# Patient Record
Sex: Female | Born: 1970 | Race: White | Hispanic: No | Marital: Married | Smoking: Never smoker
Health system: Southern US, Community
[De-identification: ages and names within clinical notes are randomized; demographics above are authoritative.]

## PROBLEM LIST (undated history)

## (undated) HISTORY — PX: COSMETIC SURGERY: SHX468

## (undated) HISTORY — PX: OTHER SURGICAL HISTORY: SHX169

---

## 2006-12-23 HISTORY — PX: AUGMENTATION MAMMAPLASTY: SUR837

## 2014-09-28 DIAGNOSIS — R1030 Lower abdominal pain, unspecified: Secondary | ICD-10-CM | POA: Insufficient documentation

## 2014-09-28 DIAGNOSIS — F32A Depression, unspecified: Secondary | ICD-10-CM | POA: Insufficient documentation

## 2014-09-28 DIAGNOSIS — F329 Major depressive disorder, single episode, unspecified: Secondary | ICD-10-CM | POA: Insufficient documentation

## 2014-09-28 DIAGNOSIS — M549 Dorsalgia, unspecified: Secondary | ICD-10-CM | POA: Insufficient documentation

## 2015-07-12 ENCOUNTER — Ambulatory Visit (INDEPENDENT_AMBULATORY_CARE_PROVIDER_SITE_OTHER): Payer: Managed Care, Other (non HMO) | Admitting: Family Medicine

## 2015-07-12 VITALS — BP 118/80 | HR 72 | Temp 98.3°F | Resp 16 | Ht 66.0 in | Wt 134.2 lb

## 2015-07-12 DIAGNOSIS — D649 Anemia, unspecified: Secondary | ICD-10-CM

## 2015-07-12 DIAGNOSIS — R079 Chest pain, unspecified: Secondary | ICD-10-CM

## 2015-07-12 DIAGNOSIS — R109 Unspecified abdominal pain: Secondary | ICD-10-CM | POA: Diagnosis not present

## 2015-07-12 DIAGNOSIS — R319 Hematuria, unspecified: Secondary | ICD-10-CM

## 2015-07-12 DIAGNOSIS — R1011 Right upper quadrant pain: Secondary | ICD-10-CM

## 2015-07-12 LAB — POCT UA - MICROSCOPIC ONLY
Casts, Ur, LPF, POC: NEGATIVE
Crystals, Ur, HPF, POC: NEGATIVE
Mucus, UA: NEGATIVE
RBC, URINE, MICROSCOPIC: NEGATIVE
YEAST UA: NEGATIVE

## 2015-07-12 LAB — POCT CBC
Granulocyte percent: 56.4 %G (ref 37–80)
HCT, POC: 26.6 % — AB (ref 37.7–47.9)
HEMOGLOBIN: 8.4 g/dL — AB (ref 12.2–16.2)
Lymph, poc: 2.6 (ref 0.6–3.4)
MCH: 23.8 pg — AB (ref 27–31.2)
MCHC: 31.8 g/dL (ref 31.8–35.4)
MCV: 75 fL — AB (ref 80–97)
MID (cbc): 0.4 (ref 0–0.9)
MPV: 6.8 fL (ref 0–99.8)
POC Granulocyte: 3.8 (ref 2–6.9)
POC LYMPH %: 38.1 % (ref 10–50)
POC MID %: 5.5 %M (ref 0–12)
Platelet Count, POC: 385 10*3/uL (ref 142–424)
RBC: 3.55 M/uL — AB (ref 4.04–5.48)
RDW, POC: 18.8 %
WBC: 6.8 10*3/uL (ref 4.6–10.2)

## 2015-07-12 LAB — POCT URINE PREGNANCY: PREG TEST UR: NEGATIVE

## 2015-07-12 LAB — POCT URINALYSIS DIPSTICK
Bilirubin, UA: NEGATIVE
Blood, UA: NEGATIVE
Glucose, UA: NEGATIVE
Ketones, UA: NEGATIVE
Leukocytes, UA: NEGATIVE
NITRITE UA: NEGATIVE
PH UA: 7.5
Protein, UA: NEGATIVE
Spec Grav, UA: 1.015
Urobilinogen, UA: 1

## 2015-07-12 LAB — D-DIMER, QUANTITATIVE (NOT AT ARMC): D DIMER QUANT: 0.27 ug{FEU}/mL (ref 0.00–0.48)

## 2015-07-12 NOTE — Patient Instructions (Addendum)
You're still anemic in our office today. Restart iron 1-2 times per day. I will check other lab tests as discussed, and will arrange for an ultrasound to look at the liver and other possible causes of your pain. Because of the anemia, stop the diclofenac for right now just in case this anemia is due to stomach issues. Recheck in the next 1 week with me and to repeat your blood count. If any increased lightheadedness or dizziness  or increased fatigue prior to that time, return here or emergency room as this can be a sign of worsening anemia.  Return to the clinic or go to the nearest emergency room if any of your symptoms worsen or new symptoms occur.

## 2015-07-12 NOTE — Progress Notes (Addendum)
Subjective:    Patient ID: Barbara Wong, female    DOB: 09/19/1971, 44 y.o.   MRN: [id number] This chart was scribed for Meredith Staggers, MD by Jolene Provost, Medical Scribe. This patient was seen in Room 11 and the patient's care was started a 6:12 PM.  Chief Complaint  Patient presents with  . Flank Pain    x 2 months    HPI HPI Comments: Barbara Wong is a 44 y.o. female who presents to [facility] complaining of RUQ abdominal pain for the last two months. Pt was seen by her PCP Dr. Cleophas Dunker five days ago for the same sx and was noted to have hematuria on urinalysis and slight anemia. Pt states her abdominal pain is worse with walking or standing for long periods and has gotten worse over the last two months (lasting longer, more severe pain, coming more often). Pt denies increased pain with deep breathing. Pt denies SOB or cough. Pt denies past hx of stomach ulcers. Pt denies heavy menses or changes in menses in the last few months. Pt states her menstrual cycle lasts 4-5 days, and is heavy on the first day but light after that. Pt denies tarry stools or blood in stools.   Pt states she is intermittently light headed, with no change over the last few months. Pt states she also has intermittent mild left chest pain, not associated with exertion. Pt denies heart racing or palpitations. Pt denies hx of blood clots, or family hx of blood clots. Pt states she has a past hx of UTI and bladder infections. Pt denies PMHx of kidney infection. Pt denies hematuria, dysuria, urinary frequency or urgency. Pt denies nausea, vomiting. Pt states her pain is not associated with eating any specific foods.   Pt states her PCP prescribed her voltaren  once per day, zanaflex  once per day.   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  There are no active problems to display for this patient.  History reviewed. No pertinent past medical history. Past Surgical History  Procedure Laterality Date  .  Cesarean section    . 3 c-sections    . Cosmetic surgery     No Known Allergies Prior to Admission medications   Not on File   History   Social History  . Marital Status: Married    Spouse Name: N/A  . Number of Children: N/A  . Years of Education: N/A   Occupational History  . Not on file.   Social History Main Topics  . Smoking status: Never Smoker   . Smokeless tobacco: Not on file  . Alcohol Use: No  . Drug Use: No  . Sexual Activity: Not on file   Other Topics Concern  . Not on file   Social History Narrative  . No narrative on file    Review of Systems  Constitutional: Negative for fever, chills, diaphoresis, activity change and unexpected weight change.  Respiratory: Negative for cough and shortness of breath.   Cardiovascular: Negative for palpitations and leg swelling.  Gastrointestinal: Positive for abdominal pain. Negative for nausea, vomiting, blood in stool and anal bleeding.  Genitourinary: Negative for dysuria, urgency, frequency, hematuria, difficulty urinating and menstrual problem.       Objective:   Physical Exam  Constitutional: She is oriented to person, place, and time. She appears well-developed and well-nourished. No distress.  HENT:  Head: Normocephalic and atraumatic.  Eyes: Conjunctivae and EOM are normal. Pupils are equal, round, and reactive  to light.  Neck: Neck supple. Carotid bruit is not present.  Cardiovascular: Normal rate, regular rhythm, normal heart sounds and intact distal pulses.   No murmur heard. Pulmonary/Chest: Effort normal and breath sounds normal. No respiratory distress. She has no wheezes.  Abdominal: Soft. She exhibits no pulsatile midline mass. There is no tenderness.  Slight tenderness in the RUQ just under the rib margin. Negative murphy's sign. Megative Mcburney's point.  Musculoskeletal: Normal range of motion.  No CVA tenderness. Ribs non tender.  Neurological: She is alert and oriented to person, place,  and time. Coordination normal.  Skin: Skin is warm and dry. She is not diaphoretic.  Psychiatric: She has a normal mood and affect. Her behavior is normal.  Nursing note and vitals reviewed.   Filed Vitals:   07/12/15 1801  BP: 118/80  Pulse: 72  Temp: 98.3 F (36.8 C)  TempSrc: Oral  Resp: 16  Height: 5\' 6"  (1.676 m)  Weight: 134 lb 3.2 oz (60.873 kg)  SpO2: 99%   Labs from other office drawn on July 15: Hemoglobin 8.9 HCT 27.9, RBC 3.61, WBC normal 6.5, MCV low 77, RDW elevated at 16.5, platelets 409, normal SED rate 11, normal CMP with creatinine 0.7, normal LFTs. Urine positive for urine greater than 300 protein. Positive nitrite, and moderate LE. Micro 0-1 WBC. Greater than 50 RBC, no bacteria.  Results for orders placed or performed in visit on 07/12/15  POCT CBC  Result Value Ref Range   WBC 6.8 4.6 - 10.2 K/uL   Lymph, poc 2.6 0.6 - 3.4   POC LYMPH PERCENT 38.1 10 - 50 %L   MID (cbc) 0.4 0 - 0.9   POC MID % 5.5 0 - 12 %M   POC Granulocyte 3.8 2 - 6.9   Granulocyte percent 56.4 37 - 80 %G   RBC 3.55 (A) 4.04 - 5.48 M/uL   Hemoglobin 8.4 (A) 12.2 - 16.2 g/dL   HCT, POC 16.126.6 (A) 09.637.7 - 47.9 %   MCV 75.0 (A) 80 - 97 fL   MCH, POC 23.8 (A) 27 - 31.2 pg   MCHC 31.8 31.8 - 35.4 g/dL   RDW, POC 04.518.8 %   Platelet Count, POC 385 142 - 424 K/uL   MPV 6.8 0 - 99.8 fL  POCT urinalysis dipstick  Result Value Ref Range   Color, UA yellow    Clarity, UA sl. cloudy    Glucose, UA negative    Bilirubin, UA negative    Ketones, UA negative    Spec Grav, UA 1.015    Blood, UA negative    pH, UA 7.5    Protein, UA negative    Urobilinogen, UA 1.0    Nitrite, UA negative    Leukocytes, UA Negative Negative  POCT urine pregnancy  Result Value Ref Range   Preg Test, Ur Negative Negative  POCT UA - Microscopic Only  Result Value Ref Range   WBC, Ur, HPF, POC 0-1    RBC, urine, microscopic Negative    Bacteria, U Microscopic 1+    Mucus, UA Negative    Epithelial cells,  urine per micros 5-10    Crystals, Ur, HPF, POC Negative    Casts, Ur, LPF, POC Negative    Yeast, UA Negative    Amorphous Large        Assessment & Plan:   Barbara Wong is a 44 y.o. female RUQ abdominal pain - Plan: POCT CBC, POCT urinalysis dipstick, POCT urine pregnancy,  US Abdomen CompleteRight flank pain - Plan: POCT urinalysis dipstick, POCT urine pregnancy, US Abdomen Complete  -Long-standing right upper quadrant/flank pain. Minimally tender on exam, but no association with food, negative Murphy's sign. Less likely gallbladder source but DDX includes functional GB disorder.   -Repeat urine testing today was negative for infection or hematuria.  -Will order abdominal ultrasound to evaluate for hepatomegaly, ovarian cyst or other cause of pain in her right upper quadrant.   -Recheck in one week to determine next step.  -Hold NSAID for now given anemia and this pain.  Chest pain, unspecified chest pain type - Plan: D-dimer, quantitative (not at [facility])  - Rare, fleeting sensation for some time. No acute changes recently. Has had intermittent leg pains in the past which may be RLS, but will check d-dimer to determine if further imaging needed. Unlikely DVT/PE, but with location of pain if this is elevated, consider CT of chest.  Anemia, unspecified anemia type - Plan: POCT CBC, IBC Panel, US Abdomen Complete  -Likely iron deficiency anemia by history.  Hemodynamically stable here in the office, but discussed fairly low hemoglobin at this time.  She is overall asymptomatic with the exception of only occasional lightheadedness with standing up quickly, will start over-the-counter iron supplementation 1-2 daily check iron panel, and repeat CBC in 1 week  -If any symptoms of lower hemoglobin such as lightheadedness dizziness or fatigue, advised to return here or emergency room for repeat CBC and evaluation  Hematuria - Plan: POCT urinalysis dipstick, POCT urine pregnancy, POCT UA - Microscopic  Only  -Resolved. Likely due to menses when checked last week.  Meds ordered this encounter  Medications  . diclofenac (VOLTAREN) 75 MG EC tablet    Sig: Take 75 mg by mouth daily.  Marland Kitchen tiZANidine (ZANAFLEX) 4 MG capsule    Sig: Take 4 mg by mouth daily.   Patient Instructions  You're still anemic in our office today. Restart iron 1-2 times per day. I will check other lab tests as discussed, and will arrange for an ultrasound to look at the liver and other possible causes of your pain. Because of the anemia, stop the diclofenac for right now just in case this anemia is due to stomach issues. Recheck in the next 1 week with me and to repeat your blood count. If any increased lightheadedness or dizziness  or increased fatigue prior to that time, return here or emergency room as this can be a sign of worsening anemia.  Return to the clinic or go to the nearest emergency room if any of your symptoms worsen or new symptoms occur.       I personally performed the services described in this documentation, which was scribed in my presence. The recorded information has been reviewed and considered, and addended by me as needed.

## 2015-07-17 LAB — IBC PANEL
%SAT: 4 % — AB (ref 20–55)
TIBC: 499 ug/dL — AB (ref 250–470)
UIBC: 480 ug/dL — AB (ref 125–400)

## 2015-07-17 LAB — IRON: IRON: 19 ug/dL — AB (ref 42–145)

## 2015-07-18 ENCOUNTER — Ambulatory Visit
Admission: RE | Admit: 2015-07-18 | Discharge: 2015-07-18 | Disposition: A | Discharge status: Home / Self Care | Payer: Managed Care, Other (non HMO) | Source: Ambulatory Visit | Attending: Family Medicine | Admitting: Family Medicine

## 2015-07-18 DIAGNOSIS — R1011 Right upper quadrant pain: Secondary | ICD-10-CM

## 2015-07-18 DIAGNOSIS — R109 Unspecified abdominal pain: Secondary | ICD-10-CM

## 2015-07-18 DIAGNOSIS — D649 Anemia, unspecified: Secondary | ICD-10-CM

## 2015-07-26 ENCOUNTER — Telehealth: Payer: Self-pay

## 2015-07-26 ENCOUNTER — Telehealth: Payer: Self-pay | Admitting: Nurse Practitioner

## 2015-07-26 NOTE — Telephone Encounter (Signed)
Pt states she doesn't think the over the counter iron is helping,should she have a rx   Best phone is [phone]    Pharmacy [facility] [address] in [place]

## 2015-07-27 MED ORDER — FERROUS SULFATE 325 (65 FE) MG PO TBEC: 325.0000 mg | DELAYED_RELEASE_TABLET | Freq: Three times a day (TID) | ORAL | Status: DC

## 2015-07-27 NOTE — Telephone Encounter (Signed)
Spoke with pt, advised Rx sent in and Mario's message.

## 2015-07-27 NOTE — Telephone Encounter (Signed)
Can we Rx? 

## 2015-07-27 NOTE — Telephone Encounter (Signed)
Pt understood.

## 2015-07-27 NOTE — Telephone Encounter (Signed)
It's not clear what symptom it is not helping. I certainly don't mind sending a script but it will take some time for her iron stores to replete. If she experiences constipation associated with iron supplementation, she should try docusate or miralax. Either way, she needs to come back for recheck of her anemia as it was pretty low. In cases such as these, a source needs to be identified. Thank you!

## 2015-07-27 NOTE — Telephone Encounter (Signed)
Left message for pt to call back  °

## 2019-10-12 ENCOUNTER — Ambulatory Visit (INDEPENDENT_AMBULATORY_CARE_PROVIDER_SITE_OTHER): Payer: Managed Care, Other (non HMO)

## 2019-10-12 ENCOUNTER — Other Ambulatory Visit: Payer: Self-pay

## 2019-10-12 ENCOUNTER — Ambulatory Visit (INDEPENDENT_AMBULATORY_CARE_PROVIDER_SITE_OTHER): Payer: Managed Care, Other (non HMO) | Admitting: Podiatry

## 2019-10-12 ENCOUNTER — Other Ambulatory Visit: Payer: Self-pay | Admitting: Podiatry

## 2019-10-12 DIAGNOSIS — S99921A Unspecified injury of right foot, initial encounter: Secondary | ICD-10-CM

## 2019-10-12 DIAGNOSIS — M778 Other enthesopathies, not elsewhere classified: Secondary | ICD-10-CM

## 2019-10-12 DIAGNOSIS — M7751 Other enthesopathy of right foot: Secondary | ICD-10-CM | POA: Diagnosis not present

## 2019-10-12 MED ORDER — MELOXICAM 15 MG PO TABS: 15.0000 mg | ORAL_TABLET | Freq: Every day | ORAL | 1 refills | Status: DC

## 2019-10-15 NOTE — Progress Notes (Signed)
   HPI: 48 y.o. female presenting today as a new patient with a chief complaint of intermittent throbbing pain to the right fourth toe that began about three months ago [place]. Touching the toenail increases the pain. She has been taking Ibuprofen for treatment. Patient is here for further evaluation and treatment.   No past medical history on file.   Physical Exam: General: The patient is alert and oriented x3 in no acute distress.  Dermatology: Skin is warm, dry and supple bilateral lower extremities. Negative for open lesions or macerations.  Vascular: Palpable pedal pulses bilaterally. No edema or erythema noted. Capillary refill within normal limits.  Neurological: Epicritic and protective threshold grossly intact bilaterally.   Musculoskeletal Exam: Pain with palpation noted to the right fourth toe. Range of motion within normal limits to all pedal and ankle joints bilateral. Muscle strength 5/5 in all groups bilateral.   Radiographic Exam:  Normal osseous mineralization. Joint spaces preserved. No fracture/dislocation/boney destruction.    Assessment: 1. Capsulitis right fourth toe   Plan of Care:  1. Patient evaluated. X-Rays reviewed.  2. Post op shoe dispensed. Weightbearing for four weeks.  3. Prescription for Meloxicam provided to patient. 4. Return to clinic as needed.       Edrick Kins, DPM [facility]  Dr. Edrick Kins, DPM    [address], [place] [address]                Office [phone]  Fax [phone]

## 2019-12-19 ENCOUNTER — Other Ambulatory Visit: Payer: Self-pay | Admitting: Podiatry

## 2020-02-23 ENCOUNTER — Other Ambulatory Visit: Payer: Self-pay

## 2020-02-23 MED ORDER — MELOXICAM 15 MG PO TABS: 15.0000 mg | ORAL_TABLET | Freq: Every day | ORAL | 3 refills | Status: AC

## 2020-02-23 NOTE — Telephone Encounter (Signed)
Pharmacy refill request for Meloxicam 15 mg   Per Dr. Michel Harrow verbal order, ok to give refills.  Script has been sent to pharmacy

## 2020-03-13 ENCOUNTER — Other Ambulatory Visit: Payer: Self-pay | Admitting: Obstetrics and Gynecology

## 2020-03-16 ENCOUNTER — Other Ambulatory Visit: Payer: Self-pay | Admitting: Nurse Practitioner

## 2020-03-16 DIAGNOSIS — N6320 Unspecified lump in the left breast, unspecified quadrant: Secondary | ICD-10-CM

## 2020-04-03 ENCOUNTER — Ambulatory Visit
Admission: RE | Admit: 2020-04-03 | Discharge: 2020-04-03 | Disposition: A | Discharge status: Home / Self Care | Payer: 59 | Source: Ambulatory Visit | Attending: Nurse Practitioner | Admitting: Nurse Practitioner

## 2020-04-03 ENCOUNTER — Other Ambulatory Visit: Payer: Self-pay | Admitting: Nurse Practitioner

## 2020-04-03 DIAGNOSIS — N6320 Unspecified lump in the left breast, unspecified quadrant: Secondary | ICD-10-CM

## 2020-06-14 IMAGING — US US BREAST*L* LIMITED INC AXILLA
1 series · 13 of 20 positions shown · non-contrast
Comparison: None.

CLINICAL DATA: 48-year-old female presenting for evaluation of a
palpable lump in the medial left breast. Her doctor gave her course
of antibiotics.

EXAM:
2D DIGITAL DIAGNOSTIC BILATERAL MAMMOGRAM WITH IMPLANTS, CAD AND
ADJUNCT TOMO
The patient has retropectoral implants. Standard and implant
displaced views were performed.
BILATERAL BREAST ULTRASOUND

[Series 1: us breast*left* limited inc axilla · 0.06mm/px · 13 of 20 slices shown]
[im 1/20]
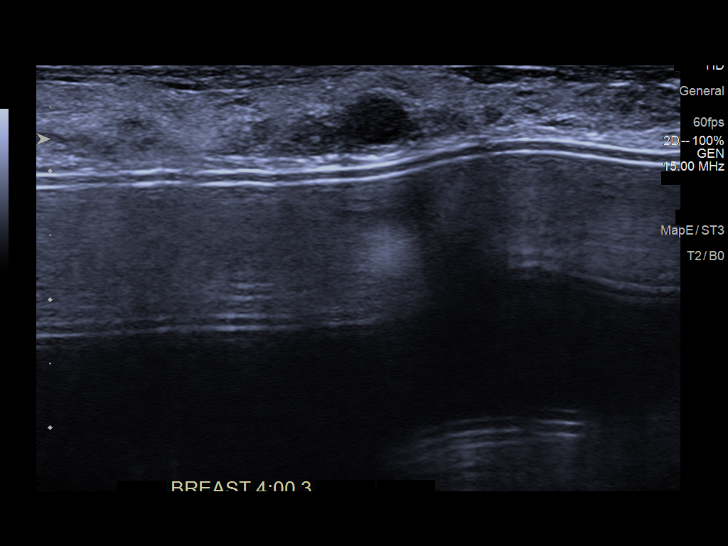
[im 3/20]
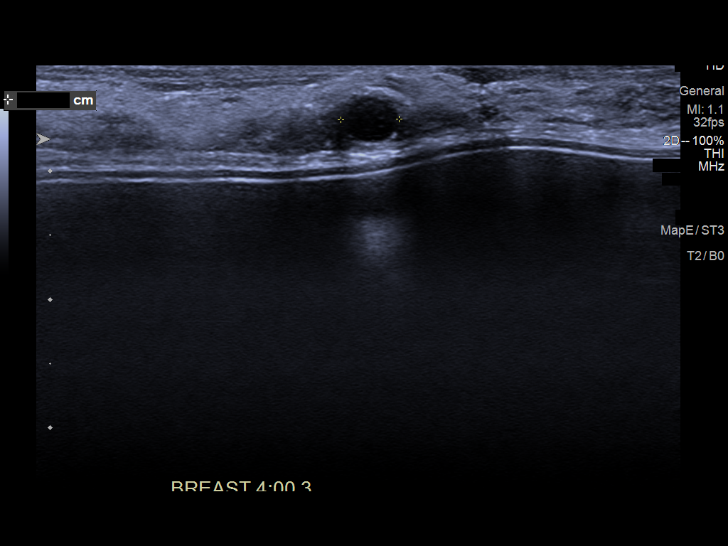
[im 4/20]
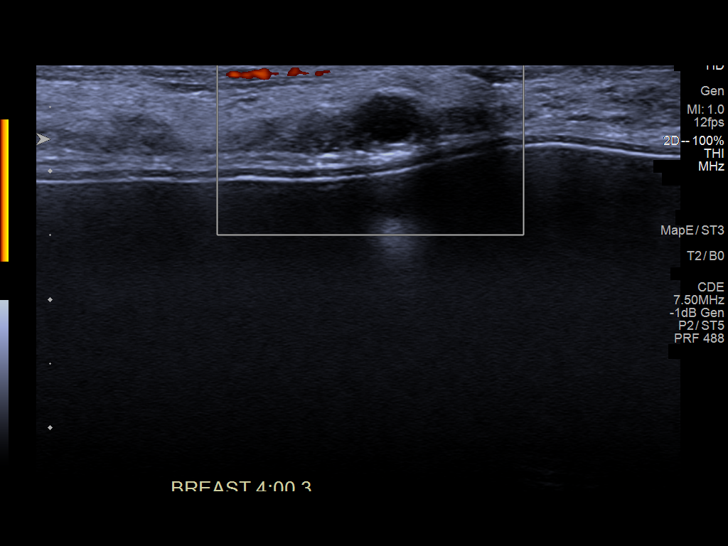
[im 6/20]
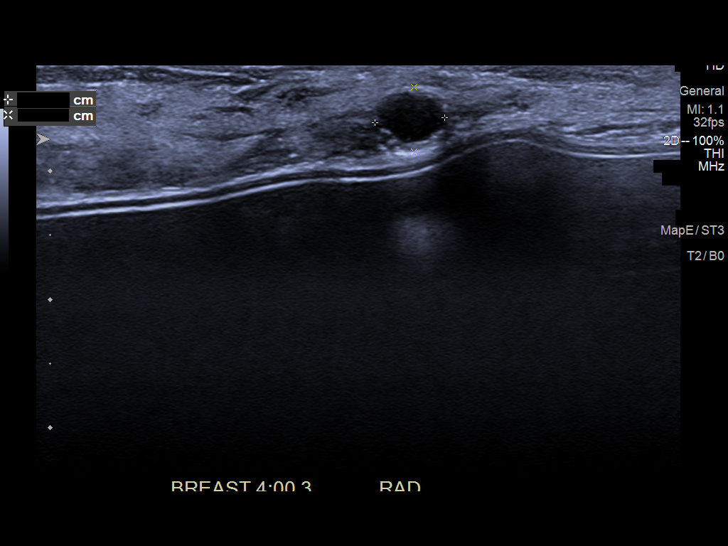
[im 7/20]
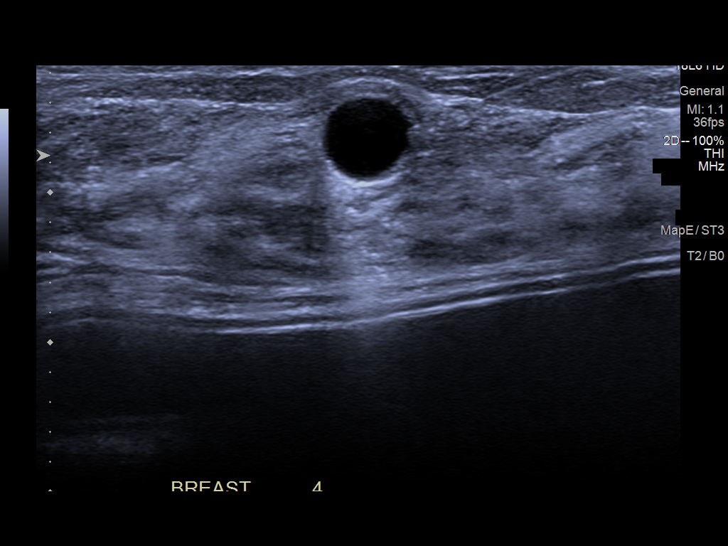
[im 9/20]
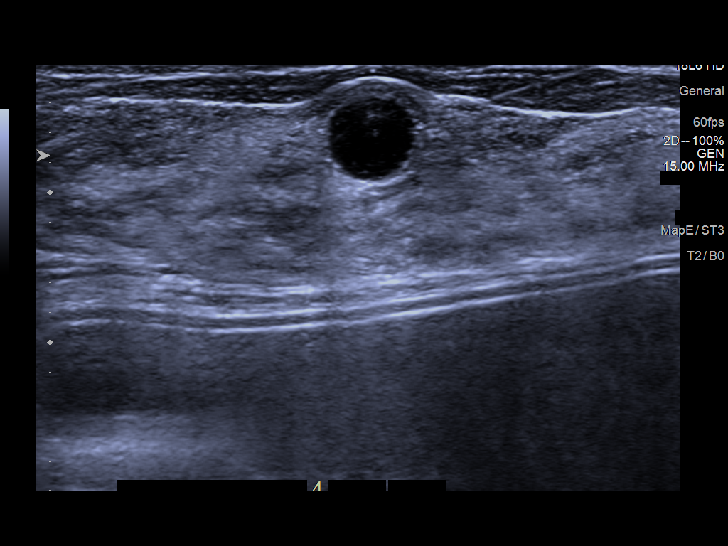
[im 11/20]
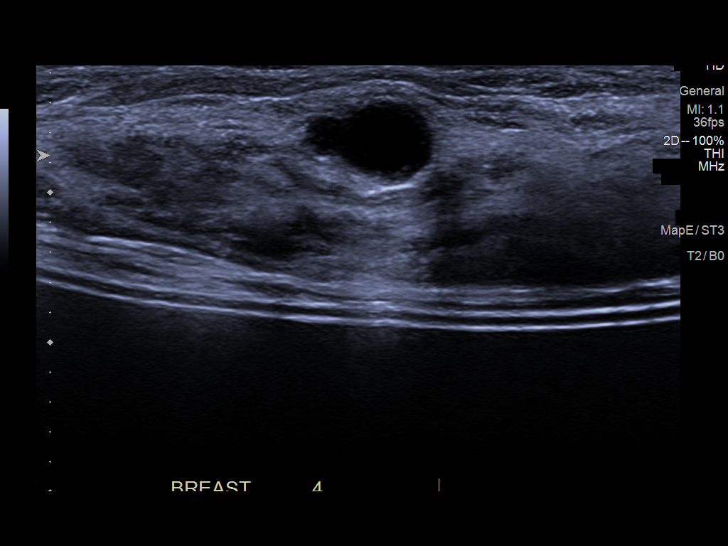
[im 12/20]
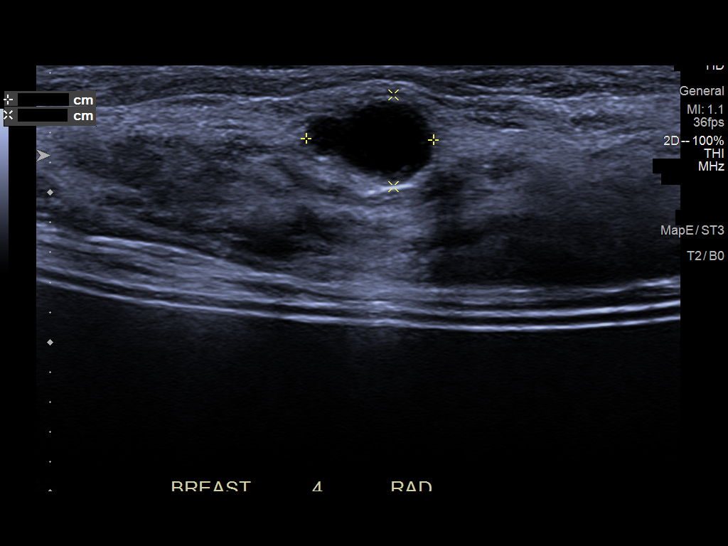
[im 14/20]
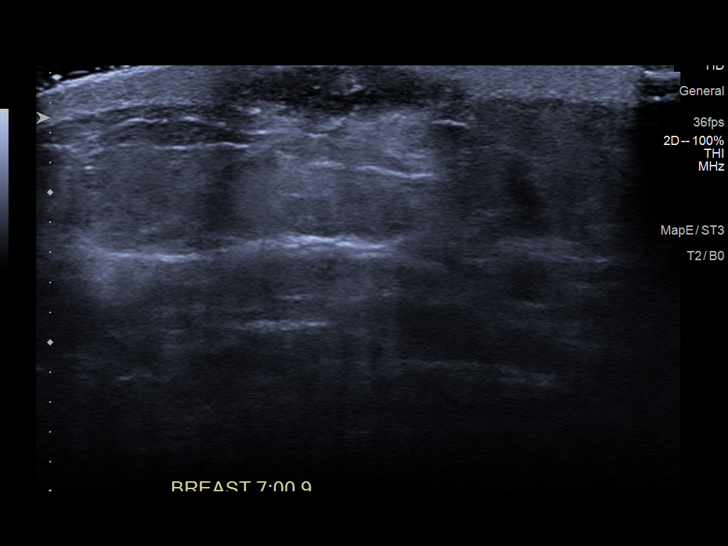
[im 15/20]
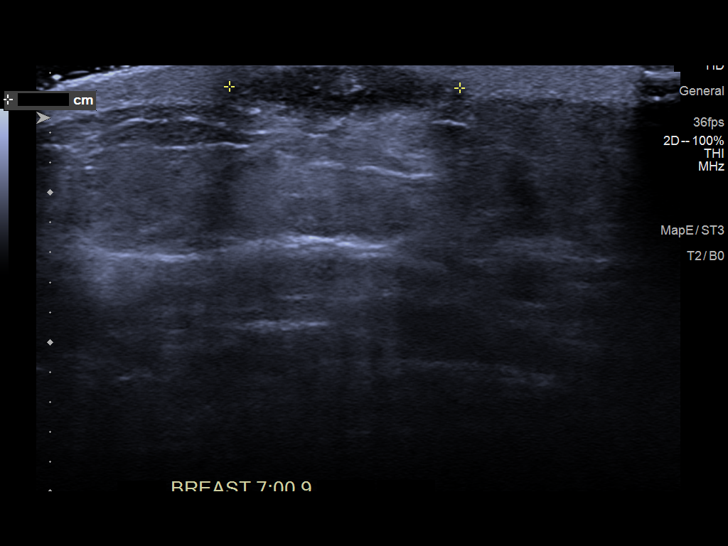
[im 17/20]
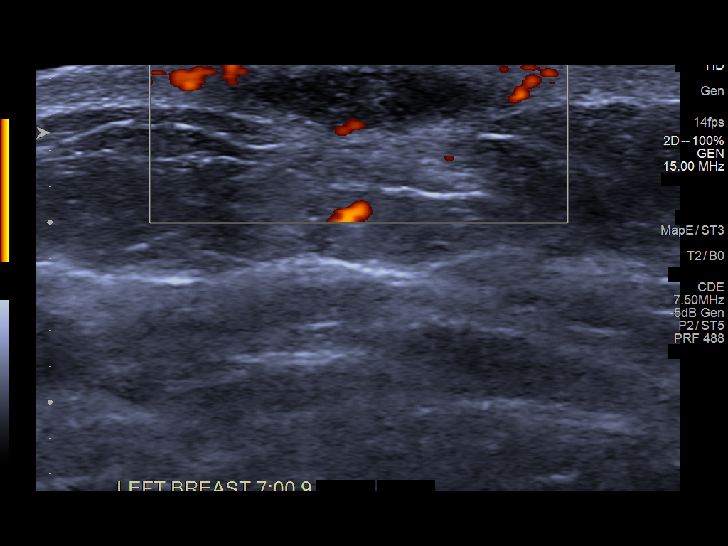
[im 18/20]
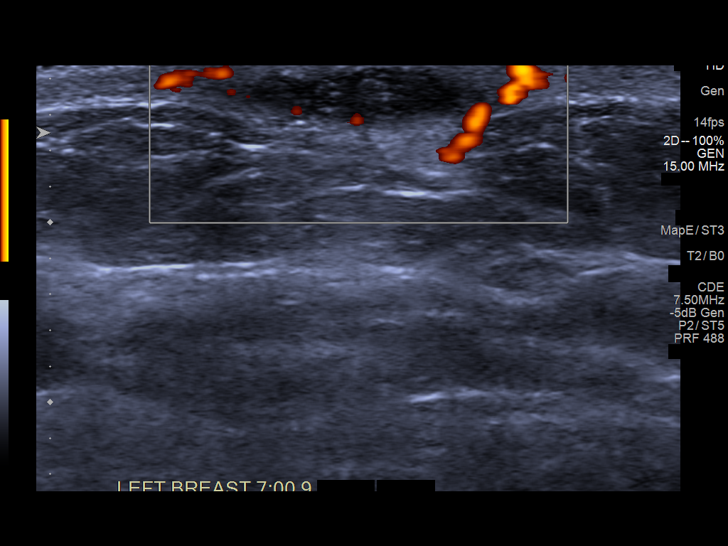
[im 20/20]
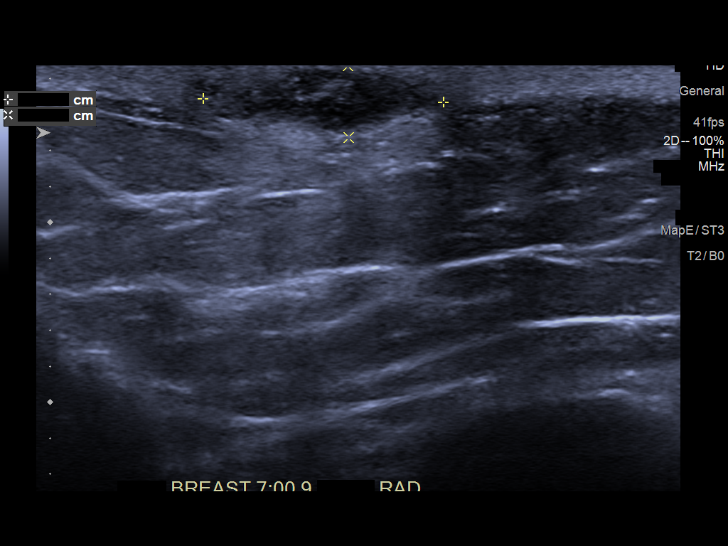

[13 of 20 positions shown; findings below may reference images not displayed]

ACR Breast Density Category d: The breast tissue is extremely dense,
which lowers the sensitivity of mammography.
FINDINGS: In the inferior to lower outer quadrant of the right breast,
posterior depth there is a probable oval obscured mass. Another
possible obscured masses seen the lateral posterior left breast.
Spot compression tomosynthesis images over the palpable site of
concern demonstrate a focal area of skin thickening and subcutaneous
stranding. No other suspicious calcifications, masses or areas of
distortion are seen in the bilateral breasts.

Mammographic images were processed with CAD.

Physical exam of the palpable site in the medial left breast
demonstrates a protruding superficial palpable lump with a central
punctum, consistent with a sebaceous cyst.

Ultrasound of the palpable site in the left breast at 7 o'clock, 9
cm from the nipple demonstrates a hypoechoic mass within the skin
measuring 1.5 x 0.4 x 1.3 cm. Ultrasound of the left breast at 12
o'clock, 4 cm from the nipple demonstrates a 9 mm bilobed benign
cyst. A similar appearing cyst is seen in the left breast at 4
o'clock, 3 cm from the nipple measuring 5 mm.

Ultrasound of the right breast at 8 o'clock, 3 cm from the nipple
demonstrates a bilobed anechoic oval circumscribed mass measuring
1.3 x 0.9 x 1.2 cm. Multiple other benign-appearing cysts are seen
scattered through the lateral right breast, with representative
images acquired.
IMPRESSION: 1. The palpable lump in the a medial left breast corresponds with a
benign sebaceous cyst.

2. Multiple bilateral scattered masses corresponds with benign
fibrocystic changes.

3.  No evidence of malignancy in the bilateral breasts.

RECOMMENDATION:
Screening mammogram in one year.(Code:BN-R-64G)

I have discussed the findings and recommendations with the patient.
If applicable, a reminder letter will be sent to the patient
regarding the next appointment.

BI-RADS CATEGORY  2: Benign.

## 2020-06-14 IMAGING — MG MM  DIGITAL DIAGNOSTIC BREAST BILAT IMPLANT W/ TOMO W/ CAD
5 series · 6 of 17 positions shown · non-contrast
Comparison: None.

CLINICAL DATA: 48-year-old female presenting for evaluation of a
palpable lump in the medial left breast. Her doctor gave her course
of antibiotics.

EXAM:
2D DIGITAL DIAGNOSTIC BILATERAL MAMMOGRAM WITH IMPLANTS, CAD AND
ADJUNCT TOMO
The patient has retropectoral implants. Standard and implant
displaced views were performed.
BILATERAL BREAST ULTRASOUND

[R CC]
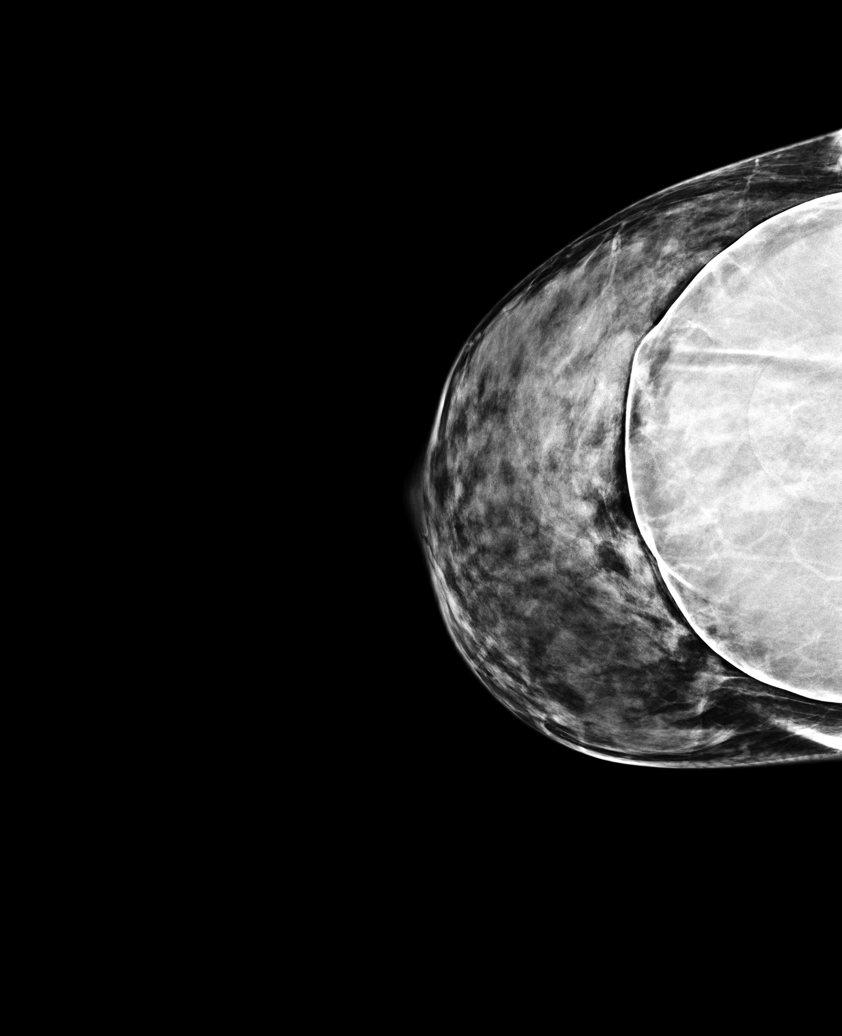

[R CC synth-2D]
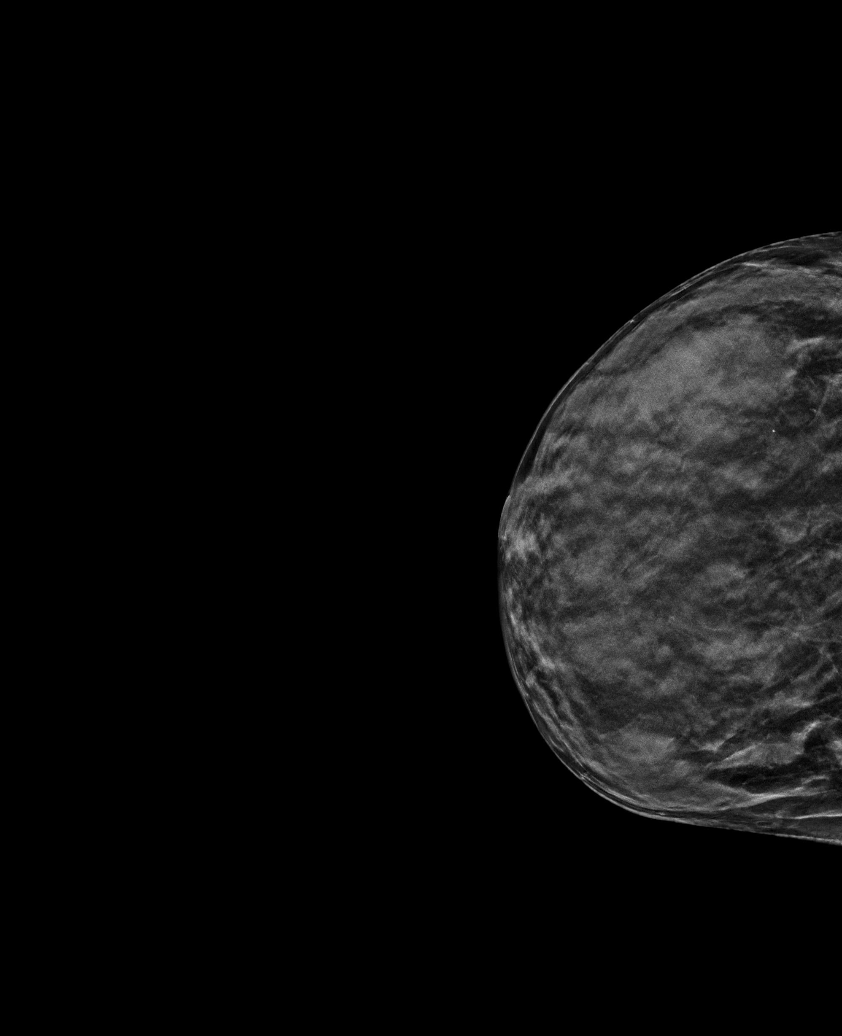

[L CCID BREAST TOMOSYNTHESIS IMAGE tomo · 2 of 54 frames shown]
[frame 18/54]
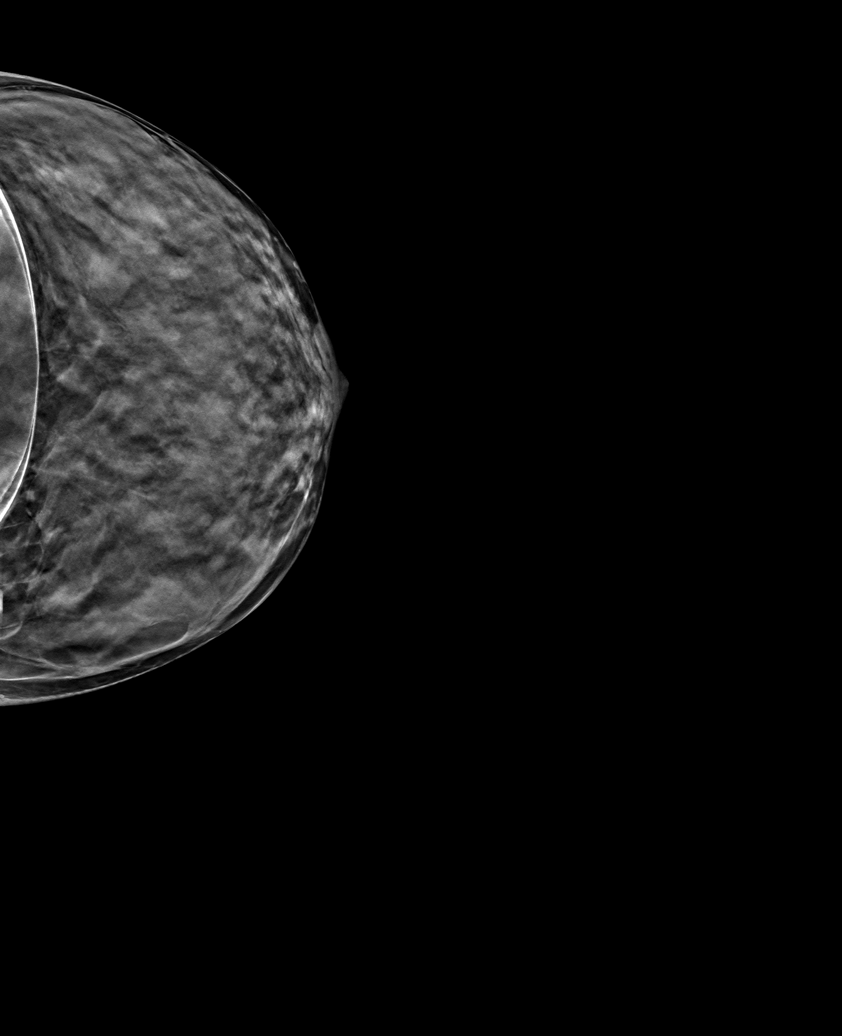
[frame 27/54]
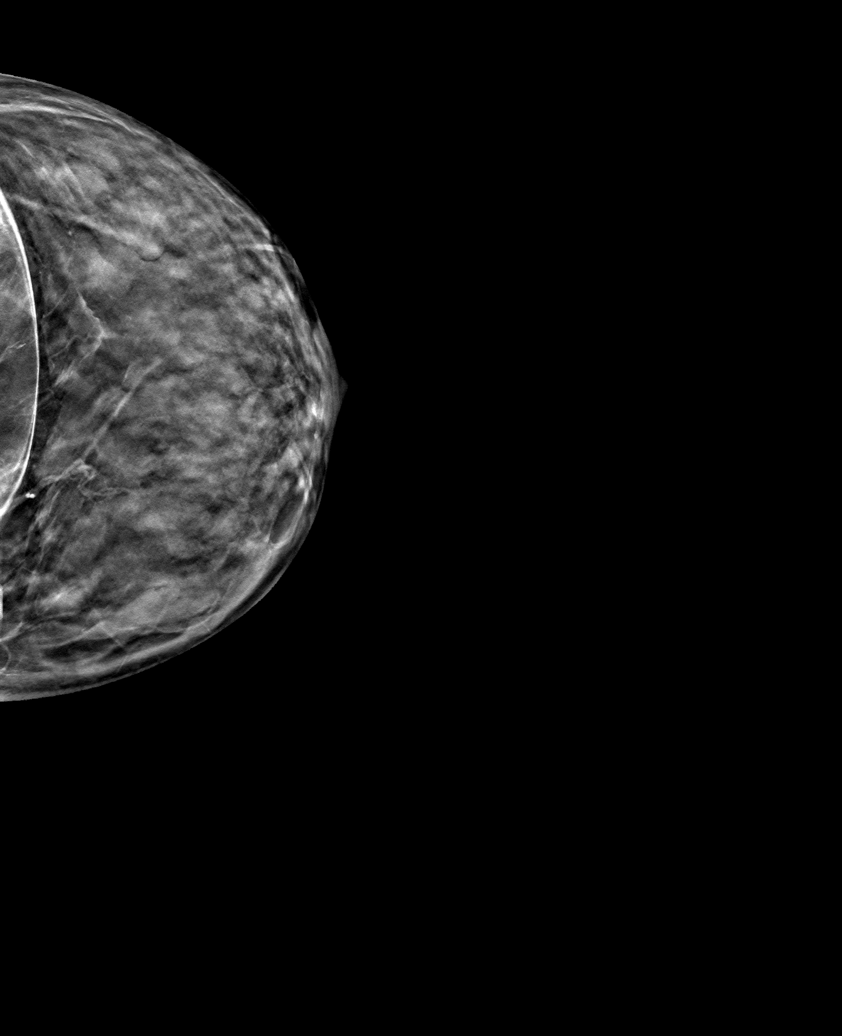

[L TAN tomo · tomo slice 13/26.0]
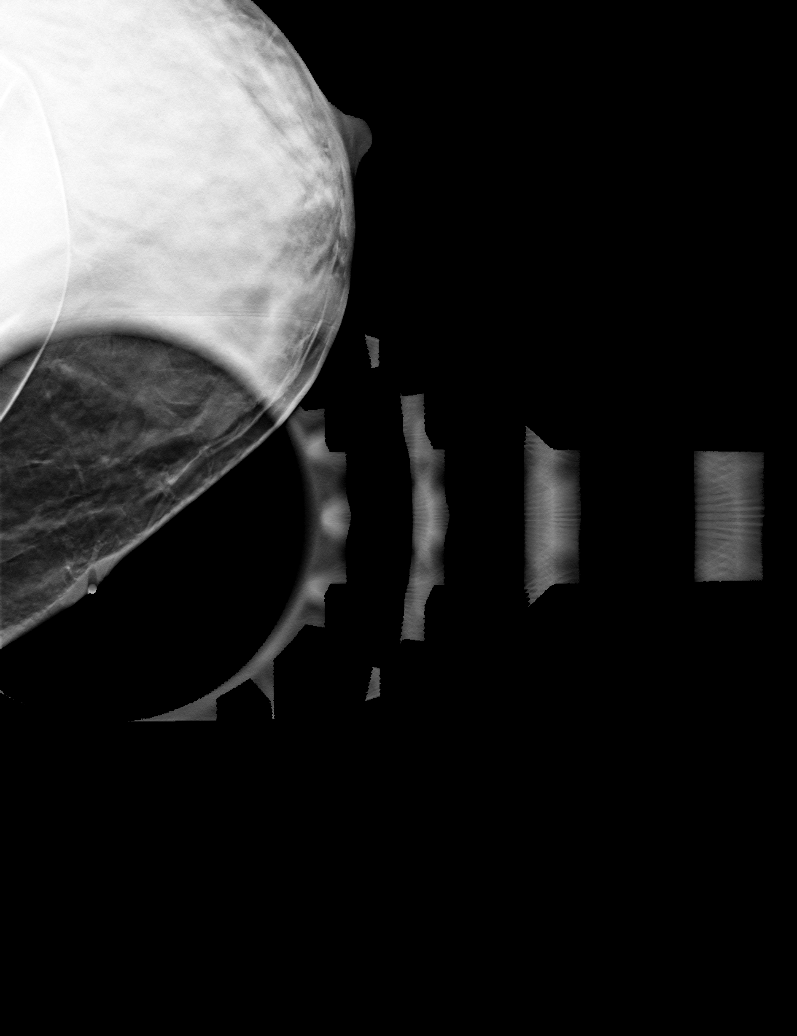

[L MLOID BREAST TOMOSYNTHESIS IMAGE tomo · tomo slice 23/45.0]
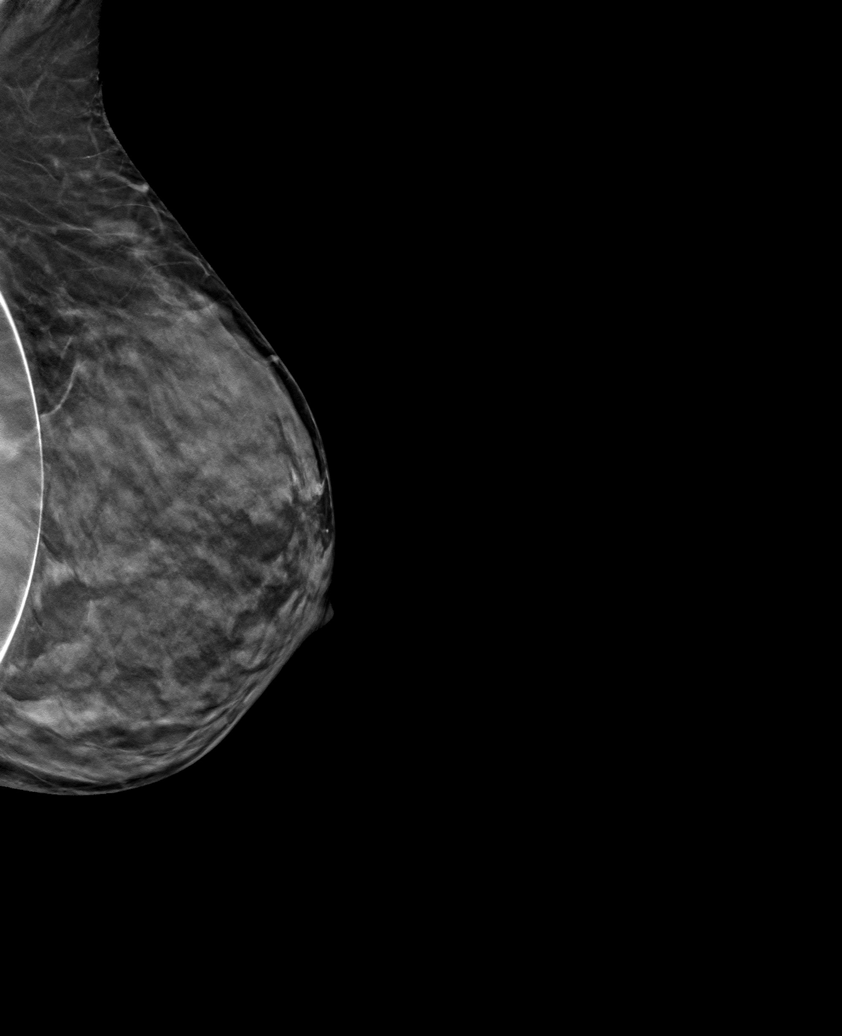

[6 of 17 positions shown; findings below may reference images not displayed]

ACR Breast Density Category d: The breast tissue is extremely dense,
which lowers the sensitivity of mammography.
FINDINGS: In the inferior to lower outer quadrant of the right breast,
posterior depth there is a probable oval obscured mass. Another
possible obscured masses seen the lateral posterior left breast.
Spot compression tomosynthesis images over the palpable site of
concern demonstrate a focal area of skin thickening and subcutaneous
stranding. No other suspicious calcifications, masses or areas of
distortion are seen in the bilateral breasts.

Mammographic images were processed with CAD.

Physical exam of the palpable site in the medial left breast
demonstrates a protruding superficial palpable lump with a central
punctum, consistent with a sebaceous cyst.

Ultrasound of the palpable site in the left breast at 7 o'clock, 9
cm from the nipple demonstrates a hypoechoic mass within the skin
measuring 1.5 x 0.4 x 1.3 cm. Ultrasound of the left breast at 12
o'clock, 4 cm from the nipple demonstrates a 9 mm bilobed benign
cyst. A similar appearing cyst is seen in the left breast at 4
o'clock, 3 cm from the nipple measuring 5 mm.

Ultrasound of the right breast at 8 o'clock, 3 cm from the nipple
demonstrates a bilobed anechoic oval circumscribed mass measuring
1.3 x 0.9 x 1.2 cm. Multiple other benign-appearing cysts are seen
scattered through the lateral right breast, with representative
images acquired.
IMPRESSION: 1. The palpable lump in the a medial left breast corresponds with a
benign sebaceous cyst.

2. Multiple bilateral scattered masses corresponds with benign
fibrocystic changes.

3.  No evidence of malignancy in the bilateral breasts.

RECOMMENDATION:
Screening mammogram in one year.(Code:BN-R-64G)

I have discussed the findings and recommendations with the patient.
If applicable, a reminder letter will be sent to the patient
regarding the next appointment.

BI-RADS CATEGORY  2: Benign.
